# Patient Record
Sex: Male | Born: 1980 | Race: Black or African American | Hispanic: No | Marital: Single | Smoking: Current every day smoker
Health system: Southern US, Community
[De-identification: ages and names within clinical notes are randomized; demographics above are authoritative.]

## PROBLEM LIST (undated history)

## (undated) DIAGNOSIS — R519 Headache, unspecified: Secondary | ICD-10-CM

## (undated) DIAGNOSIS — R51 Headache: Secondary | ICD-10-CM

---

## 2010-12-05 ENCOUNTER — Emergency Department (HOSPITAL_COMMUNITY)
Admission: EM | Admit: 2010-12-05 | Discharge: 2010-12-05 | Disposition: A | Discharge status: Home / Self Care | Payer: Self-pay | Attending: Emergency Medicine | Admitting: Emergency Medicine

## 2010-12-05 DIAGNOSIS — J329 Chronic sinusitis, unspecified: Secondary | ICD-10-CM | POA: Insufficient documentation

## 2010-12-05 DIAGNOSIS — R51 Headache: Secondary | ICD-10-CM | POA: Insufficient documentation

## 2010-12-05 DIAGNOSIS — J3489 Other specified disorders of nose and nasal sinuses: Secondary | ICD-10-CM | POA: Insufficient documentation

## 2011-05-05 ENCOUNTER — Inpatient Hospital Stay (INDEPENDENT_AMBULATORY_CARE_PROVIDER_SITE_OTHER)
Admission: RE | Admit: 2011-05-05 | Discharge: 2011-05-05 | Disposition: A | Discharge status: Home / Self Care | Payer: Self-pay | Source: Ambulatory Visit | Attending: Family Medicine | Admitting: Family Medicine

## 2011-05-05 ENCOUNTER — Ambulatory Visit (INDEPENDENT_AMBULATORY_CARE_PROVIDER_SITE_OTHER): Payer: Self-pay

## 2011-05-05 DIAGNOSIS — IMO0002 Reserved for concepts with insufficient information to code with codable children: Secondary | ICD-10-CM

## 2011-10-27 NOTE — Procedures (Deleted)
Robert Bean, Robert Bean           ACCOUNT NO.:  [id number]  MEDICAL RECORD NO.:  [id number]          PATIENT TYPE:  OUT  LOCATION:  SLEEP CENTER                 FACILITY:  [facility]  PHYSICIAN:  Makaelyn Aponte D. Maple Hudson, MD, FCCP, FACPDATE OF BIRTH:  05[birth year]07/[birth year]  DATE OF STUDY:  10/02/2011                           NOCTURNAL POLYSOMNOGRAM  REFERRING PHYSICIAN:  Leatha Gilding, M.D.  INDICATION FOR STUDY:  Hypersomnia with sleep apnea.  EPWORTH SLEEPINESS SCORE:  Replaced by pediatric BEARS form:  Bedtime- denied difficulty going to bed or falling asleep, excessive daytime sleepiness:  Indicated he has always difficult to wake up in the morning, sleepy or groggy during the day and over tired.  Awakenings during the night:  No to the child wake up at night, trouble going back to sleep or anything seemed to interrupt sleep.  Bedtime 8-8:30 p.m.  On weekdays, waking at 6:20 a.m.  On weekends, bedtime 6-7 p.m., waking 8 a.m.  Estimated 10 hour sleep.  Denied snoring or witnessed apnea, choking or gasping.  Referral concern indicated daytime somnolence.  BMI 19.7.  Weight 62 pounds, height 47 inches, age 31 year gender male.  Neck size 11 inches.  MEDICATIONS:  None.  SLEEP ARCHITECTURE:  Lights out at 21:45 p.m., lights on at 5:21 a.m. Total sleep time 396.5 minutes with sleep efficiency 87%. Stage I was absent, stage II 48%, stage III 36.7%, REM 15.3% of total sleep time. Sleep latency 41 minutes. REM latency 220 minutes. Awake after sleep onset 18 minutes. Arousal index 10.6.  BEDTIME MEDICATION:  None.  RESPIRATORY DATA:  Apnea-hypopnea index (AHI) 5.1 per hour. A total of 34 events were scored, including 18 obstructive apneas, 14 central apneas, 2 hypopneas. Events were seen in all sleep positions, and REM. REM/AHI 14.9 per hour.  OXYGEN DATA:  Snoring, not noted by the technician.  Oxygen desaturation to a nadir of 92% with mean oxygen saturation through the study 97.1% on room  air.  CARDIAC DATA:  Normal sinus rhythm.  MOVEMENT-PARASOMNIA:  A few incidental limb jerks were noted with no behavioral or movement disturbance.  No bathroom trips.  Copies of printouts recording generalized spiking activity on EEG questioned for clinical significance.  Is there a history of seizure disorder?  IMPRESSIONS-RECOMMENDATIONS: 1. Unremarkable sleep architecture for 5-year-old child in an     unfamiliar environment without bedtime medication.  Children in     this age will normally spend more time in REM, but the single night     in an unfamiliar environment will often distort that pattern, some     without clinical significance. 2. 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  AHI 5.1 per hour.  This is not     considered normal for the age group.  Is there significant upper     airway obstruction such as tonsil and adenoid hypertrophy for     allergic rhinitis?  No snoring was noted by the technician.  Normal     oxygenation with oxygen saturation nadir 92% and a mean oxygen     saturation through the study 97.1% on room air. 3. Questionable generalized EEG spiking at 6-8 per  minute,     unassociated with motor activity or incontinence.  Clinical     significance is doubtful unless there is history of seizure     disorder.     Prerana Strayer D. Maple Hudson, MD, [facility], FACP Diplomate, Biomedical engineer of Sleep Medicine Electronically Signed    CDY/MEDQ  D:  10/04/2011 12:14:25  T:  10/05/2011 08:13:33  Job:  [id number]

## 2013-12-27 ENCOUNTER — Encounter (HOSPITAL_COMMUNITY): Payer: Self-pay | Admitting: Emergency Medicine

## 2013-12-27 ENCOUNTER — Emergency Department (INDEPENDENT_AMBULATORY_CARE_PROVIDER_SITE_OTHER)
Admission: EM | Admit: 2013-12-27 | Discharge: 2013-12-27 | Disposition: A | Discharge status: Home / Self Care | Payer: Self-pay | Source: Home / Self Care | Attending: Family Medicine | Admitting: Family Medicine

## 2013-12-27 DIAGNOSIS — M7989 Other specified soft tissue disorders: Secondary | ICD-10-CM

## 2013-12-27 MED ORDER — DICLOFENAC SODIUM 75 MG PO TBEC: 75.0000 mg | DELAYED_RELEASE_TABLET | Freq: Two times a day (BID) | ORAL | Status: DC | PRN

## 2013-12-27 NOTE — Discharge Instructions (Signed)
Thank you for coming in today. Take voltaren for pain as needed.  Use a body helix ankle brace (medium).  Apply ice as needed.  Follow up with the sports medicine center as needed.  Hematoma A hematoma is a collection of blood under the skin, in an organ, in a body space, in a joint space, or in other tissue. The blood can clot to form a lump that you can see and feel. The lump is often firm and may sometimes become sore and tender. Most hematomas get better in a few days to weeks. However, some hematomas may be serious and require medical care. Hematomas can range in size from very small to very large. CAUSES  A hematoma can be caused by a blunt or penetrating injury. It can also be caused by spontaneous leakage from a blood vessel under the skin. Spontaneous leakage from a blood vessel is more likely to occur in older people, especially those taking blood thinners. Sometimes, a hematoma can develop after certain medical procedures. SIGNS AND SYMPTOMS   A firm lump on the body.  Possible pain and tenderness in the area.  Bruising.Blue, dark blue, purple-red, or yellowish skin may appear at the site of the hematoma if the hematoma is close to the surface of the skin. For hematomas in deeper tissues or body spaces, the signs and symptoms may be subtle. For example, an intra-abdominal hematoma may cause abdominal pain, weakness, fainting, and shortness of breath. An intracranial hematoma may cause a headache or symptoms such as weakness, trouble speaking, or a change in consciousness. DIAGNOSIS  A hematoma can usually be diagnosed based on your medical history and a physical exam. Imaging tests may be needed if your health care provider suspects a hematoma in deeper tissues or body spaces, such as the abdomen, head, or chest. These tests may include ultrasonography or a CT scan.  TREATMENT  Hematomas usually go away on their own over time. Rarely does the blood need to be drained out of the body.  Large hematomas or those that may affect vital organs will sometimes need surgical drainage or monitoring. HOME CARE INSTRUCTIONS   Apply ice to the injured area:   Put ice in a plastic bag.   Place a towel between your skin and the bag.   Leave the ice on for 20 minutes, 2 3 times a day for the first 1 to 2 days.   After the first 2 days, switch to using warm compresses on the hematoma.   Elevate the injured area to help decrease pain and swelling. Wrapping the area with an elastic bandage may also be helpful. Compression helps to reduce swelling and promotes shrinking of the hematoma. Make sure the bandage is not wrapped too tight.   If your hematoma is on a lower extremity and is painful, crutches may be helpful for a couple days.   Only take over-the-counter or prescription medicines as directed by your health care provider. SEEK IMMEDIATE MEDICAL CARE IF:   You have increasing pain, or your pain is not controlled with medicine.   You have a fever.   You have worsening swelling or discoloration.   Your skin over the hematoma breaks or starts bleeding.   Your hematoma is in your chest or abdomen and you have weakness, shortness of breath, or a change in consciousness.  Your hematoma is on your scalp (caused by a fall or injury) and you have a worsening headache or a change in alertness or consciousness.  MAKE SURE YOU:   Understand these instructions.  Will watch your condition.  Will get help right away if you are not doing well or get worse. Document Released: 03/24/2004 Document Revised: 04/12/2013 Document Reviewed: 01/18/2013 [facility] Patient Information 2014 [place], [place].

## 2013-12-27 NOTE — ED Provider Notes (Signed)
Robert Bean is a 33 y.o. male who presents to Urgent Care today for left lower leg nodule. Patient has a painful nodule in his left lower leg present for about 3 weeks. It occurred without injury. He notes some radiating pain and numbness on the anterior aspect of his tibia. The nodules located in his distal lateral anterior lower leg. No medications tried yet. No fevers chills nausea vomiting or diarrhea. Symptoms worse with activity better with rest.   History reviewed. No pertinent past medical history. History  Substance Use Topics  . Smoking status: Current Every Day Smoker  . Smokeless tobacco: Not on file  . Alcohol Use: No   ROS as above Medications: No current facility-administered medications for this encounter.   Current Outpatient Prescriptions  Medication Sig Dispense Refill  . diclofenac (VOLTAREN) 75 MG EC tablet Take 1 tablet (75 mg total) by mouth 2 (two) times daily as needed.  60 tablet  0    Exam:  BP 130/79  Pulse 65  Temp(Src) 98.3 F (36.8 C) (Oral)  Resp 16  SpO2 98% Gen: Well NAD Left leg: Small firm tender nodule left anterior distal lateral lower leg just proximal to the lateral ankle joint.   Limited musculoskeletal:  Heterogeneity hypoechoic cystic structure approximately 1 cm in width and 2 cm in length . No vascular activity.  Consistent with liquefying hematoma.   No results found for this or any previous visit (from the past 24 hour(s)). No results found.  Assessment and Plan: 33 y.o. male with hematoma.   Recommend compression NSAIDs ice and rest. Follow up with sports medicine if not improving  Discussed warning signs or symptoms. Please see discharge instructions. Patient expresses understanding.    Rodolph BongEvan S Paislyn Domenico, MD 12/27/13 2132

## 2013-12-27 NOTE — ED Notes (Signed)
Lump on left lower leg and intermittent numbness of left lower leg

## 2014-03-25 ENCOUNTER — Emergency Department (HOSPITAL_COMMUNITY): Payer: Self-pay

## 2014-03-25 ENCOUNTER — Emergency Department (HOSPITAL_COMMUNITY)
Admission: EM | Admit: 2014-03-25 | Discharge: 2014-03-25 | Disposition: A | Discharge status: Home / Self Care | Payer: Self-pay | Attending: Emergency Medicine | Admitting: Emergency Medicine

## 2014-03-25 ENCOUNTER — Encounter (HOSPITAL_COMMUNITY): Payer: Self-pay | Admitting: Emergency Medicine

## 2014-03-25 DIAGNOSIS — R519 Headache, unspecified: Secondary | ICD-10-CM

## 2014-03-25 DIAGNOSIS — R51 Headache: Secondary | ICD-10-CM | POA: Insufficient documentation

## 2014-03-25 DIAGNOSIS — G4453 Primary thunderclap headache: Secondary | ICD-10-CM

## 2014-03-25 DIAGNOSIS — Z791 Long term (current) use of non-steroidal anti-inflammatories (NSAID): Secondary | ICD-10-CM | POA: Insufficient documentation

## 2014-03-25 DIAGNOSIS — G8190 Hemiplegia, unspecified affecting unspecified side: Secondary | ICD-10-CM

## 2014-03-25 DIAGNOSIS — F17200 Nicotine dependence, unspecified, uncomplicated: Secondary | ICD-10-CM | POA: Insufficient documentation

## 2014-03-25 HISTORY — DX: Headache, unspecified: R51.9

## 2014-03-25 HISTORY — DX: Headache: R51

## 2014-03-25 LAB — CBC
HEMATOCRIT: 46 % (ref 39.0–52.0)
Hemoglobin: 16.2 g/dL (ref 13.0–17.0)
MCH: 31.5 pg (ref 26.0–34.0)
MCHC: 35.2 g/dL (ref 30.0–36.0)
MCV: 89.3 fL (ref 78.0–100.0)
Platelets: 229 10*3/uL (ref 150–400)
RBC: 5.15 MIL/uL (ref 4.22–5.81)
RDW: 12.5 % (ref 11.5–15.5)
WBC: 8.6 10*3/uL (ref 4.0–10.5)

## 2014-03-25 LAB — CBG MONITORING, ED: GLUCOSE-CAPILLARY: 116 mg/dL — AB (ref 70–99)

## 2014-03-25 LAB — COMPREHENSIVE METABOLIC PANEL
ALT: 17 U/L (ref 0–53)
AST: 19 U/L (ref 0–37)
Albumin: 3.7 g/dL (ref 3.5–5.2)
Alkaline Phosphatase: 35 U/L — ABNORMAL LOW (ref 39–117)
Anion gap: 11 (ref 5–15)
BUN: 8 mg/dL (ref 6–23)
CALCIUM: 9.3 mg/dL (ref 8.4–10.5)
CO2: 27 meq/L (ref 19–32)
CREATININE: 1.01 mg/dL (ref 0.50–1.35)
Chloride: 107 mEq/L (ref 96–112)
Glucose, Bld: 106 mg/dL — ABNORMAL HIGH (ref 70–99)
Potassium: 3.8 mEq/L (ref 3.7–5.3)
Sodium: 145 mEq/L (ref 137–147)
TOTAL PROTEIN: 6.6 g/dL (ref 6.0–8.3)
Total Bilirubin: 1.3 mg/dL — ABNORMAL HIGH (ref 0.3–1.2)

## 2014-03-25 LAB — I-STAT CHEM 8, ED
BUN: 6 mg/dL (ref 6–23)
CALCIUM ION: 1.16 mmol/L (ref 1.12–1.23)
Chloride: 105 mEq/L (ref 96–112)
Creatinine, Ser: 1.1 mg/dL (ref 0.50–1.35)
Glucose, Bld: 107 mg/dL — ABNORMAL HIGH (ref 70–99)
HEMATOCRIT: 49 % (ref 39.0–52.0)
Hemoglobin: 16.7 g/dL (ref 13.0–17.0)
Potassium: 3.4 mEq/L — ABNORMAL LOW (ref 3.7–5.3)
Sodium: 142 mEq/L (ref 137–147)
TCO2: 25 mmol/L (ref 0–100)

## 2014-03-25 LAB — RAPID URINE DRUG SCREEN, HOSP PERFORMED
Amphetamines: NOT DETECTED
Barbiturates: NOT DETECTED
Benzodiazepines: NOT DETECTED
Cocaine: NOT DETECTED
OPIATES: NOT DETECTED
Tetrahydrocannabinol: POSITIVE — AB

## 2014-03-25 LAB — PROTIME-INR
INR: 1.03 (ref 0.00–1.49)
PROTHROMBIN TIME: 13.5 s (ref 11.6–15.2)

## 2014-03-25 LAB — DIFFERENTIAL
BASOS ABS: 0 10*3/uL (ref 0.0–0.1)
Basophils Relative: 0 % (ref 0–1)
EOS ABS: 0.1 10*3/uL (ref 0.0–0.7)
EOS PCT: 1 % (ref 0–5)
LYMPHS PCT: 25 % (ref 12–46)
Lymphs Abs: 2.2 10*3/uL (ref 0.7–4.0)
MONO ABS: 0.9 10*3/uL (ref 0.1–1.0)
Monocytes Relative: 10 % (ref 3–12)
Neutro Abs: 5.5 10*3/uL (ref 1.7–7.7)
Neutrophils Relative %: 64 % (ref 43–77)

## 2014-03-25 LAB — I-STAT TROPONIN, ED: TROPONIN I, POC: 0 ng/mL (ref 0.00–0.08)

## 2014-03-25 LAB — APTT: aPTT: 30 seconds (ref 24–37)

## 2014-03-25 IMAGING — CT CT HEAD W/O CM
1 series · 16 of 30 positions shown, 20 images · non-contrast
Comparison: None.

CLINICAL DATA: Code stroke. Headache and blurred vision. Left-sided
weakness and numbness

EXAM:
CT HEAD WITHOUT CONTRAST
TECHNIQUE: Contiguous axial images were obtained from the base of the skull
through the vertex without intravenous contrast.

[Series 2: head 5.0 h30s · axial · 0.46mm/px · z∈[-138,+7]mm · 16 of 33 slices shown, 20 images]
[im 2/33  brain]
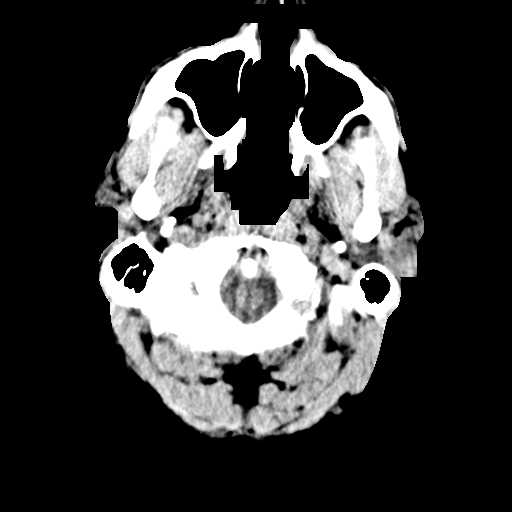
[im 2/33  bone]
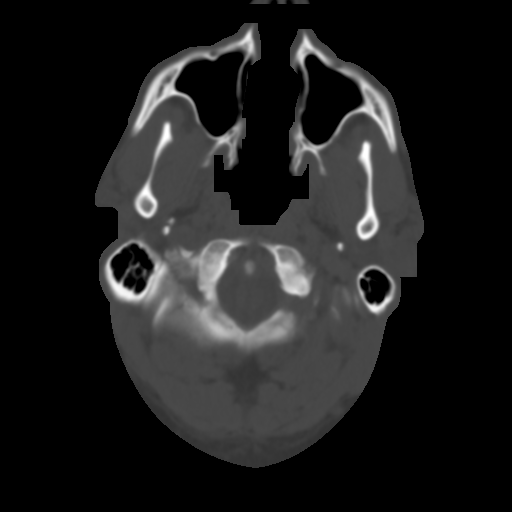
[im 4/33  brain]
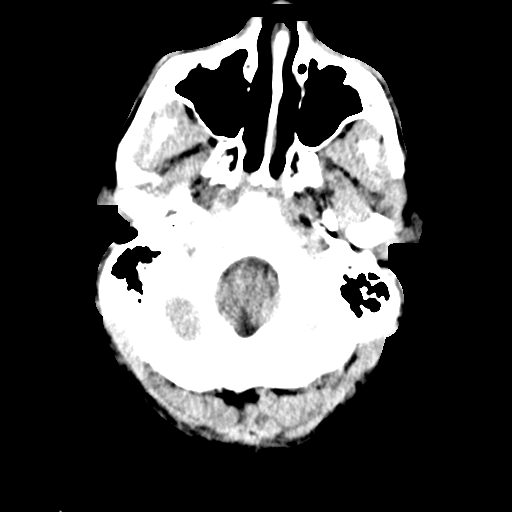
[im 6/33  brain]
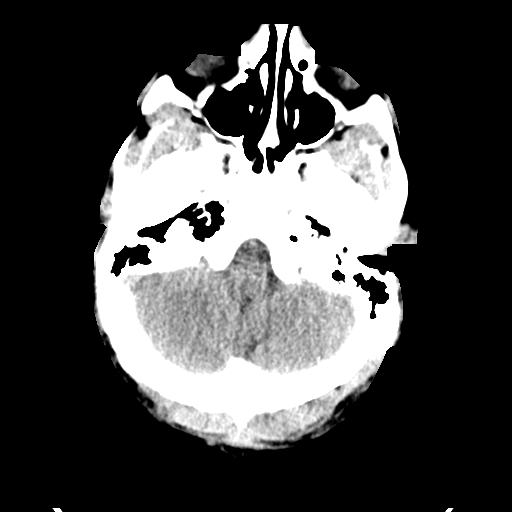
[im 8/33  brain]
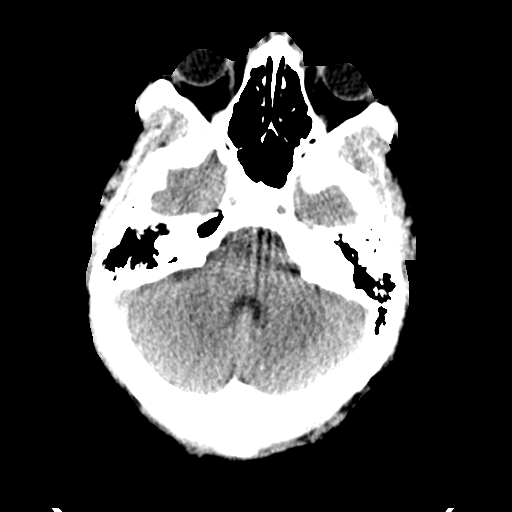
[im 9/33  brain]
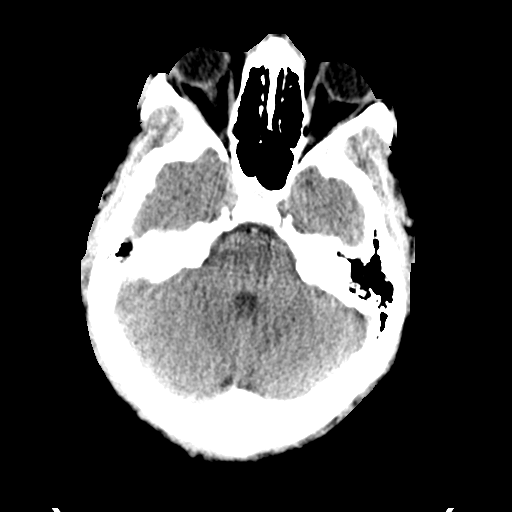
[im 9/33  bone]
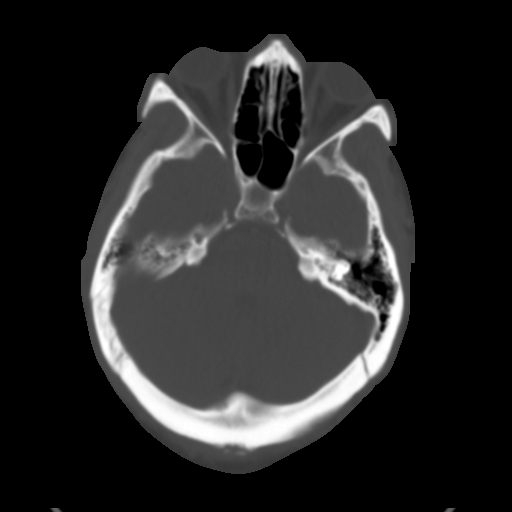
[im 12/33  brain]
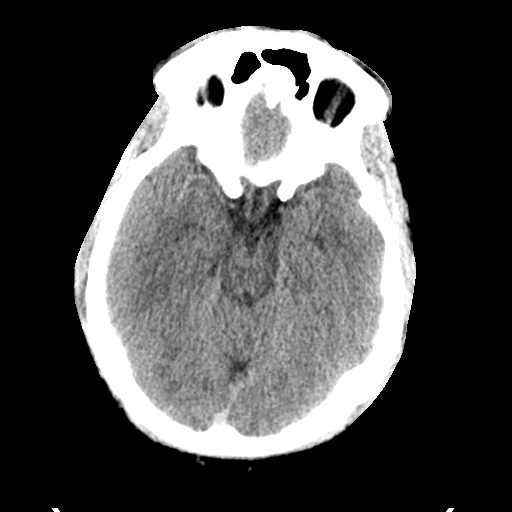
[im 14/33  brain]
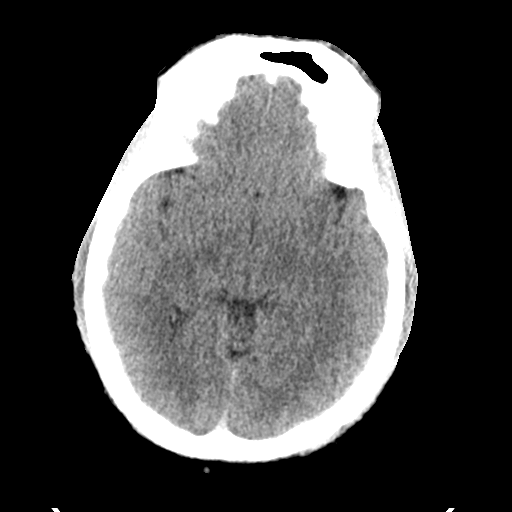
[im 16/33  brain]
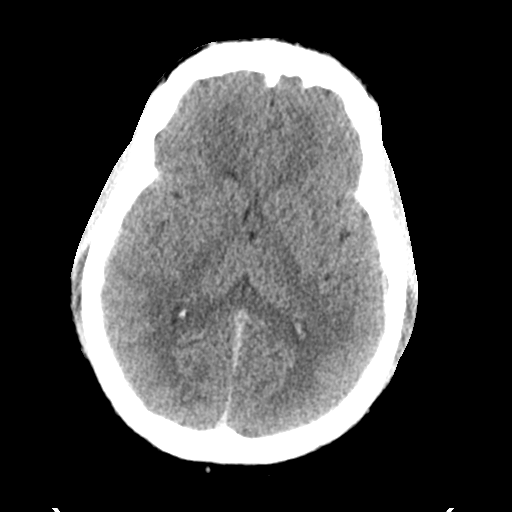
[im 17/33  brain]
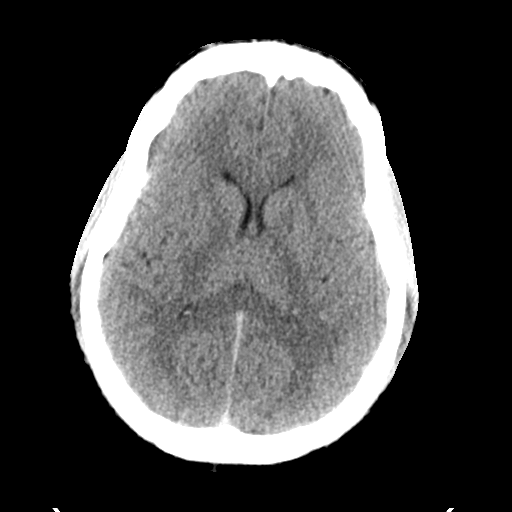
[im 17/33  bone]
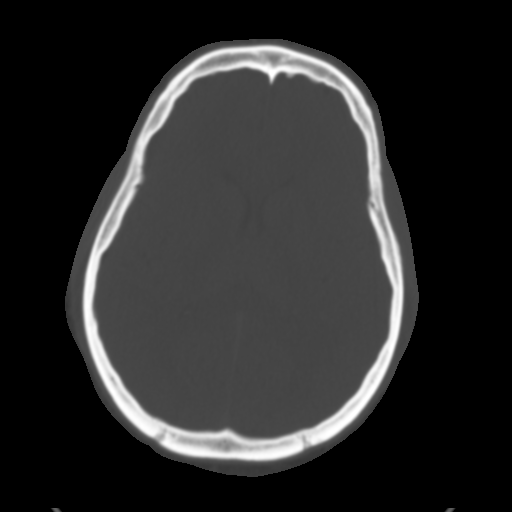
[im 19/33  brain]
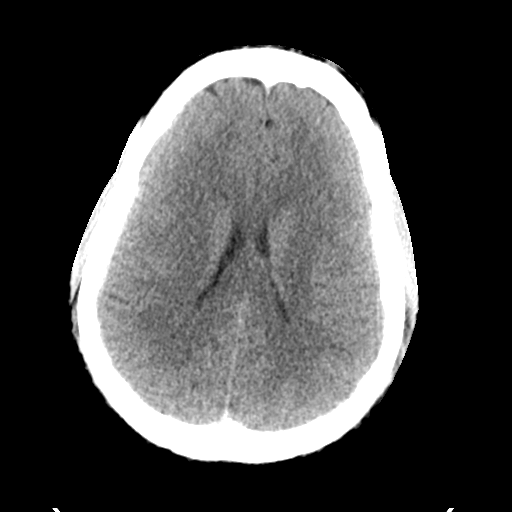
[im 21/33  brain]
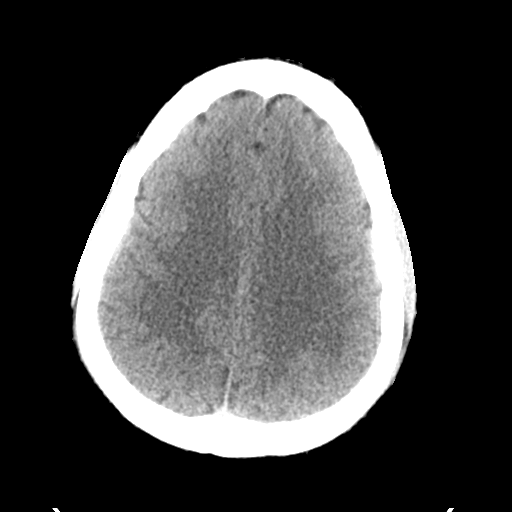
[im 24/33  brain]
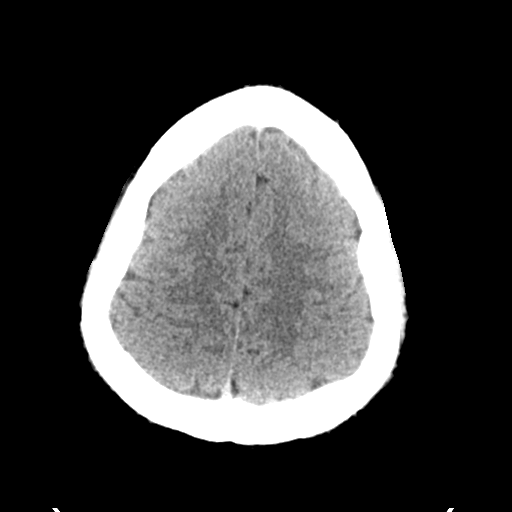
[im 25/33  brain]
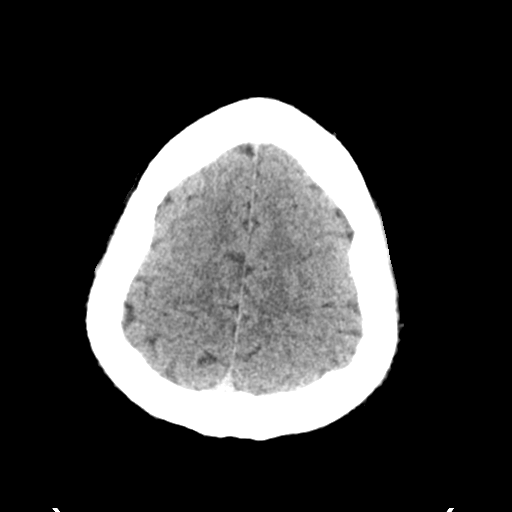
[im 25/33  bone]
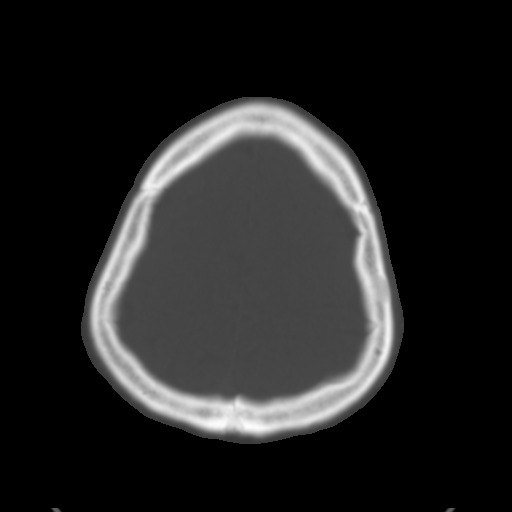
[im 27/33  brain]
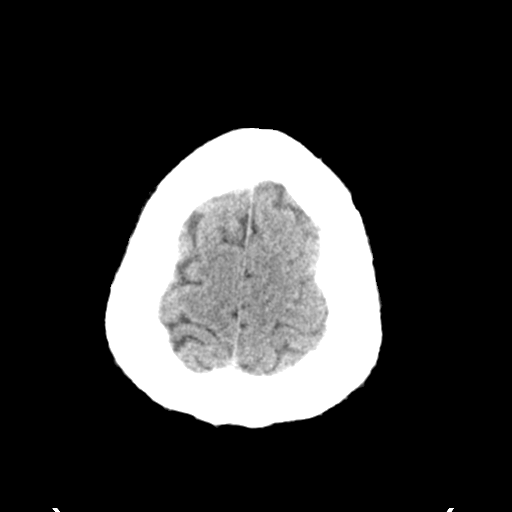
[im 29/33  brain]
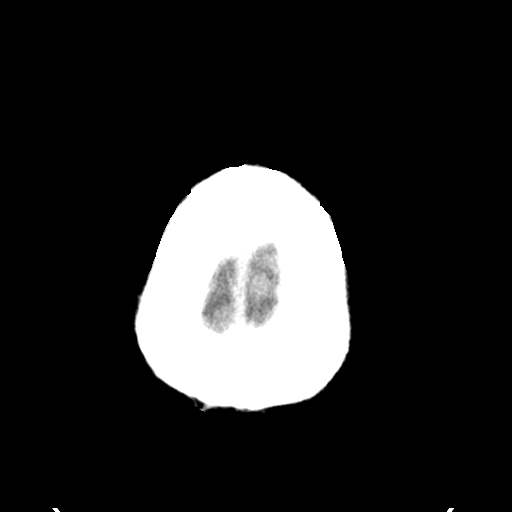
[im 31/33  brain]
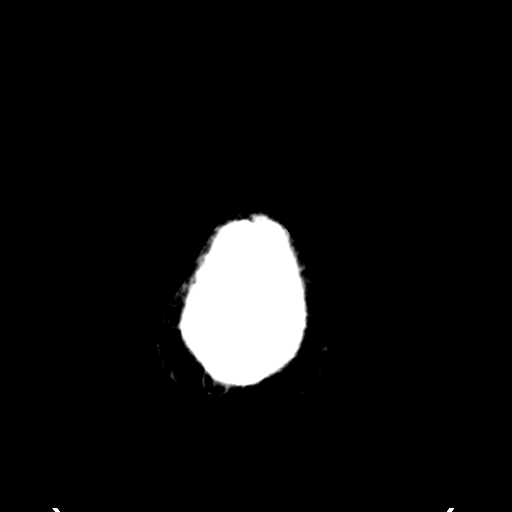

[16 of 30 positions shown; findings below may reference images not displayed]

FINDINGS: Ventricle size is normal. Negative for acute or chronic infarction.
Negative for hemorrhage or fluid collection. Negative for mass or
edema. No shift of the midline structures.

Calvarium is intact.
IMPRESSION: Normal

Critical Value/emergent results were called by telephone at the time
of interpretation on [DATE] at [DATE] to Dr. POYAZ, who
verbally acknowledged these results.

## 2014-03-25 MED ORDER — OXYCODONE-ACETAMINOPHEN 5-325 MG PO TABS: 1.0000 | ORAL_TABLET | ORAL | Status: AC | PRN

## 2014-03-25 MED ORDER — IOHEXOL 350 MG/ML SOLN
50.0000 mL | Freq: Once | INTRAVENOUS | Status: AC | PRN
  Administered 2014-03-25: 50 mL via INTRAVENOUS

## 2014-03-25 MED ORDER — HYDROMORPHONE HCL PF 1 MG/ML IJ SOLN
1.0000 mg | Freq: Once | INTRAMUSCULAR | Status: AC
  Administered 2014-03-25: 1 mg via INTRAVENOUS
  Filled 2014-03-25: qty 1

## 2014-03-25 NOTE — Consult Note (Signed)
Referring Physician: Ray    Chief Complaint: HA, blurry vision and left sided weakness and numbness  HPI: Robert Bean is an 33 y.o. male who reports that he was at home watching television when he had the acute onset of headache. Describes the headache as being above his left eye and sharp.  He reports hitting his head a few times and then noting blurry vision and left sided numbness and weakness.  Patient reports that he had some associated nausea and vomiting.  Reports light sensitivity as well.  Rates headache at a 10/10. Reports a history of headaches.  Reports they are usually frontal and not as severe.  They do not have associated nausea, vomiting, photophobia or phonophobia.  They usually resolve on their own.    Date last known well: Date: 03/25/2014 Time last known well: Time: 13:00 tPA Given: No: Outside time window  Past Medical History  Diagnosis Date  . Headache     History reviewed. No pertinent past surgical history.  Family history: Mother and grandmother with hypertension.  Mother with diabetes as well.  Father alive and well.  Social History:  reports that he has been smoking.  He does not have any smokeless tobacco history on file. He reports that he does not drink alcohol or use illicit drugs.  Allergies: No Known Allergies  Medications: I have reviewed the patient's current medications. Prior to Admission:   Current outpatient prescriptions:diclofenac (VOLTAREN) 75 MG EC tablet, Take 1 tablet (75 mg total) by mouth 2 (two) times daily as needed., Disp: 60 tablet, Rfl: 0  ROS: History obtained from the patient  General ROS: negative for - chills, fatigue, fever, night sweats, weight gain or weight loss Psychological ROS: negative for - behavioral disorder, hallucinations, memory difficulties, mood swings or suicidal ideation Ophthalmic ROS: negative for - blurry vision, double vision, eye pain or loss of vision ENT ROS: dry mouth Allergy and Immunology  ROS: negative for - hives or itchy/watery eyes Hematological and Lymphatic ROS: negative for - bleeding problems, bruising or swollen lymph nodes Endocrine ROS: negative for - galactorrhea, hair pattern changes, polydipsia/polyuria or temperature intolerance Respiratory ROS: negative for - cough, hemoptysis, shortness of breath or wheezing Cardiovascular ROS: negative for - chest pain, dyspnea on exertion, edema or irregular heartbeat Gastrointestinal ROS: negative for - abdominal pain, diarrhea, hematemesis, nausea/vomiting or stool incontinence Genito-Urinary ROS: negative for - dysuria, hematuria, incontinence or urinary frequency/urgency Musculoskeletal ROS: negative for - joint swelling or muscular weakness Neurological ROS: as noted in HPI Dermatological ROS: negative for rash and skin lesion changes  Physical Examination: There were no vitals taken for this visit.  Neurologic Examination: Mental Status: Alert, oriented, thought content appropriate.  Speech fluent without evidence of aphasia.  Able to follow 3 step commands without difficulty. Cranial Nerves: II: Discs flat bilaterally; Left lateral superior quadrantanopia, pupils equal, round, reactive to light and accommodation III,IV, VI: ptosis not present, extra-ocular motions intact bilaterally V,VII: smile symmetric, facial light touch sensation decreased on the left VIII: hearing normal bilaterally IX,X: gag reflex present XI: bilateral shoulder shrug XII: midline tongue extension Motor: 5/5 on the right.  Gives less voluntary strength on the LUE at a 4+-5-/5 but has no pronator drift.  With volition has difficulty lifting the LLE but shows no reciprocal downward movement of the RLE and when the right is lifted able to maintain the left independently.   Tone and bulk:normal tone throughout; no atrophy noted Sensory: Pinprick and light touch decreased in the  LUE and LLE Deep Tendon Reflexes: 2+ and symmetric  throughout Plantars: Right: downgoing   Left: downgoing Cerebellar: normal finger-to-nose and normal heel-to-shin test Gait: Unable to test CV: pulses palpable throughout     Laboratory Studies:  Basic Metabolic Panel:  Recent Labs Lab 03/25/14 1740  NA 142  K 3.4*  CL 105  GLUCOSE 107*  BUN 6  CREATININE 1.10    Liver Function Tests: No results found for this basename: AST, ALT, ALKPHOS, BILITOT, PROT, ALBUMIN,  in the last 168 hours No results found for this basename: LIPASE, AMYLASE,  in the last 168 hours No results found for this basename: AMMONIA,  in the last 168 hours  CBC:  Recent Labs Lab 03/25/14 1726 03/25/14 1740  WBC 8.6  --   NEUTROABS 5.5  --   HGB 16.2 16.7  HCT 46.0 49.0  MCV 89.3  --   PLT 229  --     Cardiac Enzymes: No results found for this basename: CKTOTAL, CKMB, CKMBINDEX, TROPONINI,  in the last 168 hours  BNP: No components found with this basename: POCBNP,   CBG:  Recent Labs Lab 03/25/14 1740  GLUCAP 116*    Microbiology: No results found for this or any previous visit.  Coagulation Studies:  Recent Labs  03/25/14 1726  LABPROT 13.5  INR 1.03    Urinalysis: No results found for this basename: COLORURINE, APPERANCEUR, LABSPEC, PHURINE, GLUCOSEU, HGBUR, BILIRUBINUR, KETONESUR, PROTEINUR, UROBILINOGEN, NITRITE, LEUKOCYTESUR,  in the last 168 hours  Lipid Panel: No results found for this basename: chol,  trig,  hdl,  cholhdl,  vldl,  ldlcalc    HgbA1C:  No results found for this basename: HGBA1C    Urine Drug Screen:   No results found for this basename: labopia,  cocainscrnur,  labbenz,  amphetmu,  thcu,  labbarb    Alcohol Level: No results found for this basename: ETH,  in the last 168 hours  Other results: EKG: sinus rhythm at 51 bpm.  Imaging: Ct Head (brain) Wo Contrast  03/25/2014   CLINICAL DATA:  Code stroke. Headache and blurred vision. Left-sided weakness and numbness  EXAM: CT HEAD WITHOUT  CONTRAST  TECHNIQUE: Contiguous axial images were obtained from the base of the skull through the vertex without intravenous contrast.  COMPARISON:  None.  FINDINGS: Ventricle size is normal. Negative for acute or chronic infarction. Negative for hemorrhage or fluid collection. Negative for mass or edema. No shift of the midline structures.  Calvarium is intact.  IMPRESSION: Normal  Critical Value/emergent results were called by telephone at the time of interpretation on 03/25/2014 at 5:29 pm to Dr. Thad Ranger, who verbally acknowledged these results.   Electronically Signed   By: Marlan Palau M.D.   On: 03/25/2014 17:30    Assessment: 33 y.o. male presenting with left sided headache, left superior quadrantanopia and left sided weakness and numbness.  Initial NIHSS of 3.  Patient outside treatment window for tPA.  Head CT reviewed and shows no acute changes.  Due to thunderclap nature of headache will rule out aneurysm as well.  Symptoms likely represent an atypical migraine.  Further work up recommended.    Stroke Risk Factors - smoking  Plan: 1. CTA of the head and neck 2.  Analgesia for headache 3. Telemetry monitoring 4. Frequent neuro checks  Case discussed with Dr. Lorin Glass, MD [facility] [phone] 03/25/2014, 6:11 PM  Addendum: CTA of head and neck reviewed and unremarkable.  No evidence of  aneurysm.  Would not recommend stroke work up at this time.  No further neurologic work up recommended.    Thana Farr, MD [facility] [phone]

## 2014-03-25 NOTE — ED Provider Notes (Signed)
CSN: [id number]     Arrival date & time 03/25/14  1700 History   First MD Initiated Contact with Patient 03/25/14 1722     Chief Complaint  Patient presents with  . Code Stroke     (Consider location/radiation/quality/duration/timing/severity/associated sxs/prior Treatment) HPI 33 y.o male with sudden onset of left sided headache 4.5 hours pte.  Patient states left arm weakness and visual changes.  Patient was not exerting self and patient had sudden onset of pain with time to max pain minutes.  No similar symptoms in past.   Past Medical History  Diagnosis Date  . Headache    History reviewed. No pertinent past surgical history. No family history on file. History  Substance Use Topics  . Smoking status: Current Every Day Smoker  . Smokeless tobacco: Not on file  . Alcohol Use: No    Review of Systems  All other systems reviewed and are negative.     Allergies  Review of patient's allergies indicates no known allergies.  Home Medications   Prior to Admission medications   Medication Sig Start Date End Date Taking? Authorizing Provider  diclofenac (VOLTAREN) 75 MG EC tablet Take 1 tablet (75 mg total) by mouth 2 (two) times daily as needed. 12/27/13   Rodolph Bong, MD   There were no vitals taken for this visit. Physical Exam  Nursing note and vitals reviewed. Constitutional: He is oriented to person, place, and time. He appears well-developed and well-nourished.  HENT:  Head: Normocephalic and atraumatic.  Right Ear: External ear normal.  Left Ear: External ear normal.  Nose: Nose normal.  Mouth/Throat: Oropharynx is clear and moist.  Eyes: Conjunctivae and EOM are normal. Pupils are equal, round, and reactive to light.  Neck: Normal range of motion. Neck supple.  Cardiovascular: Normal rate, regular rhythm, normal heart sounds and intact distal pulses.   Pulmonary/Chest: Effort normal and breath sounds normal. No respiratory distress. He has no wheezes. He exhibits  no tenderness.  Abdominal: Soft. Bowel sounds are normal. He exhibits no distension and no mass. There is no tenderness. There is no guarding.  Musculoskeletal: Normal range of motion.  Neurological: He is alert and oriented to person, place, and time. He has normal reflexes. He exhibits normal muscle tone. Coordination normal.  Left palmar drift  Skin: Skin is warm and dry.  Psychiatric: He has a normal mood and affect. His behavior is normal. Judgment and thought content normal.    ED Course  Procedures (including critical care time) Labs Review Labs Reviewed  PROTIME-INR  APTT  CBC  DIFFERENTIAL  COMPREHENSIVE METABOLIC PANEL  I-STAT TROPOININ, ED  I-STAT CHEM 8, ED  CBG MONITORING, ED    Imaging Review Ct Angio Head W/cm &/or Wo Cm  03/25/2014   CLINICAL DATA:  Sudden onset of frontal headache. Left-sided weakness and numbness  EXAM: CT ANGIOGRAPHY HEAD AND NECK  TECHNIQUE: Multidetector CT imaging of the head and neck was performed using the standard protocol during bolus administration of intravenous contrast. Multiplanar CT image reconstructions and MIPs were obtained to evaluate the vascular anatomy. Carotid stenosis measurements (when applicable) are obtained utilizing NASCET criteria, using the distal internal carotid diameter as the denominator.  CONTRAST:  50mL OMNIPAQUE IOHEXOL 350 MG/ML SOLN  COMPARISON:  CT head 03/25/2014  FINDINGS: CTA HEAD FINDINGS  Ventricle size is normal. Negative for acute infarct. Negative for hemorrhage or mass.  Both vertebral arteries are patent to the basilar and are equal in size. Left PICA patent.  Right PICA not visualized. Basilar is widely patent. AICA, superior cerebellar, and posterior cerebral arteries are patent bilaterally without stenosis  The internal carotid artery is patent to the skullbase and cavernous segments. Anterior and middle cerebral arteries are patent without stenosis or filling defect.  Negative for cerebral aneurysm or  vascular malformation. Major dural sinuses are patent.  Review of the MIP images confirms the above findings.  CTA NECK FINDINGS  Negative for mass or adenopathy in the neck. Standard 3 vessel arch. Proximal great vessels widely patent  Carotid bifurcation is normal bilaterally.  Both vertebral arteries are normal bilaterally.  No evidence of atherosclerotic disease or dissection.  Review of the MIP images confirms the above findings.  IMPRESSION: Negative CTA head and neck.   Electronically Signed   By: Marlan Palau M.D.   On: 03/25/2014 18:30   Ct Head (brain) Wo Contrast  03/25/2014   CLINICAL DATA:  Code stroke. Headache and blurred vision. Left-sided weakness and numbness  EXAM: CT HEAD WITHOUT CONTRAST  TECHNIQUE: Contiguous axial images were obtained from the base of the skull through the vertex without intravenous contrast.  COMPARISON:  None.  FINDINGS: Ventricle size is normal. Negative for acute or chronic infarction. Negative for hemorrhage or fluid collection. Negative for mass or edema. No shift of the midline structures.  Calvarium is intact.  IMPRESSION: Normal  Critical Value/emergent results were called by telephone at the time of interpretation on 03/25/2014 at 5:29 pm to Dr. Thad Ranger, who verbally acknowledged these results.   Electronically Signed   By: Marlan Palau M.D.   On: 03/25/2014 17:30   Ct Angio Neck W/cm &/or Wo/cm  03/25/2014   CLINICAL DATA:  Sudden onset of frontal headache. Left-sided weakness and numbness  EXAM: CT ANGIOGRAPHY HEAD AND NECK  TECHNIQUE: Multidetector CT imaging of the head and neck was performed using the standard protocol during bolus administration of intravenous contrast. Multiplanar CT image reconstructions and MIPs were obtained to evaluate the vascular anatomy. Carotid stenosis measurements (when applicable) are obtained utilizing NASCET criteria, using the distal internal carotid diameter as the denominator.  CONTRAST:  50mL OMNIPAQUE IOHEXOL 350 MG/ML  SOLN  COMPARISON:  CT head 03/25/2014  FINDINGS: CTA HEAD FINDINGS  Ventricle size is normal. Negative for acute infarct. Negative for hemorrhage or mass.  Both vertebral arteries are patent to the basilar and are equal in size. Left PICA patent. Right PICA not visualized. Basilar is widely patent. AICA, superior cerebellar, and posterior cerebral arteries are patent bilaterally without stenosis  The internal carotid artery is patent to the skullbase and cavernous segments. Anterior and middle cerebral arteries are patent without stenosis or filling defect.  Negative for cerebral aneurysm or vascular malformation. Major dural sinuses are patent.  Review of the MIP images confirms the above findings.  CTA NECK FINDINGS  Negative for mass or adenopathy in the neck. Standard 3 vessel arch. Proximal great vessels widely patent  Carotid bifurcation is normal bilaterally.  Both vertebral arteries are normal bilaterally.  No evidence of atherosclerotic disease or dissection.  Review of the MIP images confirms the above findings.  IMPRESSION: Negative CTA head and neck.   Electronically Signed   By: Marlan Palau M.D.   On: 03/25/2014 18:30     EKG Interpretation None      MDM   Final diagnoses:  Acute nonintractable headache, unspecified headache type    Patient with sudden onset of headache with some left palmar drift on my exam and made  code stroke- seen by Dr. Ferne Coeeynold's and no tpa due to time frame and low nih stroke score.  CT head, cta head and neck without evidence of bleed  7:09 PM Patient with normal neuro exam.  Headache improving after meds.  Patient to be discharged after pain control.  Patient care discussed with Dr. Thad Rangereynolds and she does not feel patient requires any further evaluation such as lp give ct angio.   Hilario Quarryanielle S Falisha Osment, MD 03/26/14 1310

## 2014-03-25 NOTE — ED Notes (Addendum)
Pt reports sudden onset of frontal headache at 1pm with bilateral blurred vision that started 20 min later.  Also reports L sided arm weakness.  Pt states he has a history of headaches but this one is different.  No arm drift noted on triage exam. Code Stroke activated.

## 2014-03-25 NOTE — Discharge Instructions (Signed)

## 2014-03-25 NOTE — ED Notes (Signed)
Pt ambulating independently w/ steady gait on d/c in no acute distress, A&Ox4. D/c instructions reviewed w/ pt - pt denies any further questions or concerns at present. Rx given x1
